# Patient Record
Sex: Female | Born: 2005 | Hispanic: No | Marital: Single | Smoking: Never smoker
Health system: Southern US, Community
[De-identification: ages and names within clinical notes are randomized; demographics above are authoritative.]

---
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
## 2024-05-30 ENCOUNTER — Ambulatory Visit
Admission: RE | Admit: 2024-05-30 | Discharge: 2024-05-30 | Disposition: A | Discharge status: Home / Self Care | Source: Ambulatory Visit | Attending: Family Medicine | Admitting: Family Medicine

## 2024-05-30 ENCOUNTER — Other Ambulatory Visit: Payer: Self-pay

## 2024-05-30 VITALS — BP 122/77 | HR 89 | Temp 98.1°F | Resp 18

## 2024-05-30 DIAGNOSIS — J019 Acute sinusitis, unspecified: Secondary | ICD-10-CM | POA: Diagnosis not present

## 2024-05-30 MED ORDER — CEFDINIR 300 MG PO CAPS: 600.0000 mg | ORAL_CAPSULE | Freq: Every day | ORAL | 0 refills | Status: AC

## 2024-05-30 MED ORDER — FLUTICASONE PROPIONATE 50 MCG/ACT NA SUSP: 2.0000 | Freq: Every day | NASAL | 0 refills | Status: AC

## 2024-05-30 NOTE — ED Provider Notes (Signed)
 [facility]    CSN: [id number] Arrival date & time: 05/30/24  1655      History   Chief Complaint Chief Complaint  Patient presents with   Nasal Congestion    HPI Felicia Mckinney is a 18 y.o. female.   HPI Here for 2-week history of nasal congestion and a little bit of cough.  She has now had some sinus pressure and is having a lot of postnasal drainage.  No fever at any point. She has had some ear pain off and on.  NKDA  Last menstrual cycle was June 5.  History reviewed. No pertinent past medical history.  There are no active problems to display for this patient.   History reviewed. No pertinent surgical history.  OB History   No obstetric history on file.      Home Medications    Prior to Admission medications   Medication Sig Start Date End Date Taking? Authorizing Provider  cefdinir (OMNICEF) 300 MG capsule Take 2 capsules (600 mg total) by mouth daily for 7 days. 05/30/24 06/06/24 Yes Naria Abbey K, MD  fluticasone (FLONASE) 50 MCG/ACT nasal spray Place 2 sprays into both nostrils daily. 05/30/24  Yes Ashonti Leandro, Sharlet POUR, MD    Family History Family History  Problem Relation Age of Onset   Healthy Mother    Healthy Father     Social History Social History   Tobacco Use   Smoking status: Never   Smokeless tobacco: Never     Allergies   Patient has no known allergies.   Review of Systems Review of Systems   Physical Exam Triage Vital Signs ED Triage Vitals  Encounter Vitals Group     BP 05/30/24 1718 122/77     Girls Systolic BP Percentile --      Girls Diastolic BP Percentile --      Boys Systolic BP Percentile --      Boys Diastolic BP Percentile --      Pulse Rate 05/30/24 1718 89     Resp 05/30/24 1718 18     Temp 05/30/24 1718 98.1 F (36.7 C)     Temp Source 05/30/24 1718 Oral     SpO2 05/30/24 1718 98 %     Weight --      Height --      Head Circumference --      Peak Flow --      Pain Score 05/30/24 1719  0     Pain Loc --      Pain Education --      Exclude from Growth Chart --    No data found.  Updated Vital Signs BP 122/77 (BP Location: Left Arm)   Pulse 89   Temp 98.1 F (36.7 C) (Oral)   Resp 18   SpO2 98%   Visual Acuity Right Eye Distance:   Left Eye Distance:   Bilateral Distance:    Right Eye Near:   Left Eye Near:    Bilateral Near:     Physical Exam Vitals reviewed.  Constitutional:      General: She is not in acute distress.    Appearance: She is not ill-appearing, toxic-appearing or diaphoretic.  HENT:     Right Ear: Tympanic membrane and ear canal normal.     Left Ear: Tympanic membrane and ear canal normal.     Nose: Congestion present.     Mouth/Throat:     Mouth: Mucous membranes are moist.     Pharynx:  No posterior oropharyngeal erythema.     Comments: There is white and clear exudate draining in the posterior oropharynx.  Eyes:     Extraocular Movements: Extraocular movements intact.     Conjunctiva/sclera: Conjunctivae normal.     Pupils: Pupils are equal, round, and reactive to light.    Cardiovascular:     Rate and Rhythm: Normal rate and regular rhythm.     Heart sounds: No murmur heard. Pulmonary:     Effort: Pulmonary effort is normal. No respiratory distress.     Breath sounds: No stridor. No wheezing, rhonchi or rales.   Musculoskeletal:     Cervical back: Neck supple.  Lymphadenopathy:     Cervical: No cervical adenopathy.   Skin:    Capillary Refill: Capillary refill takes less than 2 seconds.     Coloration: Skin is not jaundiced or pale.   Neurological:     General: No focal deficit present.     Mental Status: She is alert and oriented to person, place, and time.   Psychiatric:        Behavior: Behavior normal.      UC Treatments / Results  Labs (all labs ordered are listed, but only abnormal results are displayed) Labs Reviewed - No data to display  EKG   Radiology No results  found.  Procedures Procedures (including critical care time)  Medications Ordered in UC Medications - No data to display  Initial Impression / Assessment and Plan / UC Course  I have reviewed the triage vital signs and the nursing notes.  Pertinent labs & imaging results that were available during my care of the patient were reviewed by me and considered in my medical decision making (see chart for details).     Omnicef is sent in for acute sinusitis and Flonase is sent in to help swelling around her sinus openings. Final Clinical Impressions(s) / UC Diagnoses   Final diagnoses:  Acute sinusitis, recurrence not specified, unspecified location     Discharge Instructions      Take cefdinir 300 mg--2 capsules together daily for 7 days  Fluticasone/Flonase nose spray--put 2 sprays in each nostril once daily      ED Prescriptions     Medication Sig Dispense Auth. Provider   cefdinir (OMNICEF) 300 MG capsule Take 2 capsules (600 mg total) by mouth daily for 7 days. 14 capsule Crescencio Jozwiak K, MD   fluticasone [facility]) 50 MCG/ACT nasal spray Place 2 sprays into both nostrils daily. 16 g Vonna Sharlet POUR, MD      PDMP not reviewed this encounter.   Vonna Sharlet POUR, MD 05/30/24 [phone]

## 2024-05-30 NOTE — Discharge Instructions (Signed)
Take cefdinir 300 mg--2 capsules together daily for 7 days  Fluticasone/Flonase nose spray--put 2 sprays in each nostril once daily

## 2024-05-30 NOTE — ED Triage Notes (Signed)
 Pt here for nasal congestion and some cough x 2 weeks

## 2024-06-22 ENCOUNTER — Ambulatory Visit
Admission: RE | Admit: 2024-06-22 | Discharge: 2024-06-22 | Disposition: A | Discharge status: Home / Self Care | Source: Ambulatory Visit

## 2024-06-22 VITALS — BP 108/73 | HR 102 | Temp 98.2°F | Resp 18 | Ht 66.0 in | Wt 180.0 lb

## 2024-06-22 DIAGNOSIS — J0191 Acute recurrent sinusitis, unspecified: Secondary | ICD-10-CM | POA: Diagnosis not present

## 2024-06-22 MED ORDER — DOXYCYCLINE HYCLATE 100 MG PO CAPS: 100.0000 mg | ORAL_CAPSULE | Freq: Two times a day (BID) | ORAL | 0 refills | Status: AC

## 2024-06-22 NOTE — ED Triage Notes (Signed)
 Sinus infection - Entered by patient  This started about 40-45 days ago with sinus congestion/pressure, I get bad sinus infections, now with a ha, ears clogged, some congestion in my nose. No fever. ? PCP.

## 2024-06-22 NOTE — ED Provider Notes (Signed)
 [facility]    CSN: [id number] Arrival date & time: 06/22/24  1455      History   Chief Complaint Chief Complaint  Patient presents with   Nasal Congestion    HPI Felicia Mckinney is a 18 y.o. female.   Patient here today for evaluation of sinus congestion and pressure that she has had for the last 40 to 45 days.  She reports that headache, ear pressure and congestion.  She has not had fever.  He does have history of sinus infections.  The history is provided by the patient.    History reviewed. No pertinent past medical history.  There are no active problems to display for this patient.   History reviewed. No pertinent surgical history.  OB History   No obstetric history on file.      Home Medications    Prior to Admission medications   Medication Sig Start Date End Date Taking? Authorizing Provider  doxycycline  (VIBRAMYCIN ) 100 MG capsule Take 1 capsule (100 mg total) by mouth 2 (two) times daily for 7 days. 06/22/24 06/29/24 Yes Billy Asberry FALCON, PA-C  fluticasone  (FLONASE ) 50 MCG/ACT nasal spray Place 2 sprays into both nostrils daily. 05/30/24  Yes Banister, Pamela K, MD  guaiFENesin (MUCINEX) 600 MG 12 hr tablet Take by mouth 2 (two) times daily.   Yes [provider]    Family History Family History  Problem Relation Age of Onset   Healthy Mother    Healthy Father     Social History Social History   Tobacco Use   Smoking status: Never    Passive exposure: Never   Smokeless tobacco: Never  Vaping Use   Vaping status: Never Used  Substance Use Topics   Alcohol use: Never   Drug use: Never     Allergies   Patient has no known allergies.   Review of Systems Review of Systems  Constitutional:  Negative for chills and fever.  HENT:  Positive for congestion and sinus pressure. Negative for ear pain and sore throat.   Eyes:  Negative for discharge and redness.  Respiratory:  Positive for cough. Negative for shortness of  breath and wheezing.   Gastrointestinal:  Negative for abdominal pain, diarrhea, nausea and vomiting.     Physical Exam Triage Vital Signs ED Triage Vitals  Encounter Vitals Group     BP 06/22/24 1508 108/73     Girls Systolic BP Percentile --      Girls Diastolic BP Percentile --      Boys Systolic BP Percentile --      Boys Diastolic BP Percentile --      Pulse Rate 06/22/24 1508 (!) 102     Resp 06/22/24 1508 18     Temp 06/22/24 1508 98.2 F (36.8 C)     Temp Source 06/22/24 1508 Oral     SpO2 06/22/24 1508 96 %     Weight 06/22/24 1504 180 lb (81.6 kg)     Height 06/22/24 1504 5' 6 (1.676 m)     Head Circumference --      Peak Flow --      Pain Score 06/22/24 1501 4     Pain Loc --      Pain Education --      Exclude from Growth Chart --    No data found.  Updated Vital Signs BP 108/73 (BP Location: Left Arm)   Pulse (!) 102   Temp 98.2 F (36.8 C) (Oral)  Resp 18   Ht 5' 6 (1.676 m)   Wt 180 lb (81.6 kg)   LMP 06/02/2024 (Exact Date)   SpO2 96%   BMI 29.05 kg/m   Visual Acuity Right Eye Distance:   Left Eye Distance:   Bilateral Distance:    Right Eye Near:   Left Eye Near:    Bilateral Near:     Physical Exam Vitals and nursing note reviewed.  Constitutional:      General: She is not in acute distress.    Appearance: Normal appearance. She is not ill-appearing.  HENT:     Head: Normocephalic and atraumatic.     Right Ear: Tympanic membrane normal.     Left Ear: Tympanic membrane normal.     Nose: Congestion present.     Mouth/Throat:     Mouth: Mucous membranes are moist.     Pharynx: No oropharyngeal exudate or posterior oropharyngeal erythema.  Eyes:     Conjunctiva/sclera: Conjunctivae normal.  Cardiovascular:     Rate and Rhythm: Normal rate and regular rhythm.     Heart sounds: Normal heart sounds. No murmur heard. Pulmonary:     Effort: Pulmonary effort is normal. No respiratory distress.     Breath sounds: Normal breath sounds.  No wheezing, rhonchi or rales.  Skin:    General: Skin is warm and dry.  Neurological:     Mental Status: She is alert.  Psychiatric:        Mood and Affect: Mood normal.        Thought Content: Thought content normal.      UC Treatments / Results  Labs (all labs ordered are listed, but only abnormal results are displayed) Labs Reviewed - No data to display  EKG   Radiology No results found.  Procedures Procedures (including critical care time)  Medications Ordered in UC Medications - No data to display  Initial Impression / Assessment and Plan / UC Course  I have reviewed the triage vital signs and the nursing notes.  Pertinent labs & imaging results that were available during my care of the patient were reviewed by me and considered in my medical decision making (see chart for details).    Will treat to cover sinusitis with doxycycline  as she was recently treated with cefdinir .  Advised follow-up if no gradual improvement with any further concerns.  Final Clinical Impressions(s) / UC Diagnoses   Final diagnoses:  Acute recurrent sinusitis, unspecified location   Discharge Instructions   None    ED Prescriptions     Medication Sig Dispense Auth. Provider   doxycycline  (VIBRAMYCIN ) 100 MG capsule Take 1 capsule (100 mg total) by mouth 2 (two) times daily for 7 days. 14 capsule Billy Asberry FALCON, PA-C      PDMP not reviewed this encounter.   Billy Asberry FALCON, PA-C 06/22/24 1920
